# Patient Record
Sex: Female | Born: 1945
Health system: Southern US, Community
[De-identification: ages and names within clinical notes are randomized; demographics above are authoritative.]

---

## 2018-10-27 ENCOUNTER — Other Ambulatory Visit (HOSPITAL_COMMUNITY): Payer: Self-pay

## 2018-10-27 ENCOUNTER — Inpatient Hospital Stay
Admission: RE | Admit: 2018-10-27 | Discharge: 2018-12-06 | Disposition: E | Payer: Self-pay | Source: Other Acute Inpatient Hospital | Attending: Internal Medicine | Admitting: Internal Medicine

## 2018-10-27 DIAGNOSIS — J189 Pneumonia, unspecified organism: Secondary | ICD-10-CM

## 2018-10-27 DIAGNOSIS — J90 Pleural effusion, not elsewhere classified: Secondary | ICD-10-CM

## 2018-10-27 DIAGNOSIS — R0603 Acute respiratory distress: Secondary | ICD-10-CM

## 2018-10-27 DIAGNOSIS — Z0189 Encounter for other specified special examinations: Secondary | ICD-10-CM

## 2018-10-27 DIAGNOSIS — Z931 Gastrostomy status: Secondary | ICD-10-CM

## 2018-10-27 DIAGNOSIS — A419 Sepsis, unspecified organism: Secondary | ICD-10-CM

## 2018-10-27 DIAGNOSIS — T85598A Other mechanical complication of other gastrointestinal prosthetic devices, implants and grafts, initial encounter: Secondary | ICD-10-CM

## 2018-10-27 DIAGNOSIS — R0902 Hypoxemia: Secondary | ICD-10-CM

## 2018-10-27 LAB — COMPREHENSIVE METABOLIC PANEL
ALT: 9 U/L (ref 0–44)
AST: 32 U/L (ref 15–41)
Albumin: 1.8 g/dL — ABNORMAL LOW (ref 3.5–5.0)
Alkaline Phosphatase: 117 U/L (ref 38–126)
Anion gap: 8 (ref 5–15)
BUN: 17 mg/dL (ref 8–23)
CO2: 22 mmol/L (ref 22–32)
Calcium: 8.8 mg/dL — ABNORMAL LOW (ref 8.9–10.3)
Chloride: 106 mmol/L (ref 98–111)
Creatinine, Ser: 0.72 mg/dL (ref 0.44–1.00)
GFR calc Af Amer: >60 mL/min (ref >60)
GFR calc non Af Amer: >60 mL/min (ref >60)
Glucose, Bld: 99 mg/dL (ref 70–99)
Potassium: 4.9 mmol/L (ref 3.5–5.1)
Sodium: 136 mmol/L (ref 135–145)
Total Bilirubin: 1.2 mg/dL (ref 0.3–1.2)
Total Protein: 5.6 g/dL — ABNORMAL LOW (ref 6.5–8.1)

## 2018-10-28 ENCOUNTER — Other Ambulatory Visit (HOSPITAL_COMMUNITY): Payer: Self-pay

## 2018-10-28 LAB — CBC WITH DIFFERENTIAL/PLATELET
Abs Immature Granulocytes: 0.06 10*3/uL (ref 0.00–0.07)
Basophils Absolute: 0 10*3/uL (ref 0.0–0.1)
Basophils Relative: 0 %
Eosinophils Absolute: 0.4 10*3/uL (ref 0.0–0.5)
Eosinophils Relative: 6 %
HCT: 28.5 % — ABNORMAL LOW (ref 36.0–46.0)
Hemoglobin: 9.1 g/dL — ABNORMAL LOW (ref 12.0–15.0)
Immature Granulocytes: 1 %
Lymphocytes Relative: 14 %
Lymphs Abs: 1 10*3/uL (ref 0.7–4.0)
MCH: 30 pg (ref 26.0–34.0)
MCHC: 31.9 g/dL (ref 30.0–36.0)
MCV: 94.1 fL (ref 80.0–100.0)
Monocytes Absolute: 0.6 10*3/uL (ref 0.1–1.0)
Monocytes Relative: 8 %
Neutro Abs: 5.3 10*3/uL (ref 1.7–7.7)
Neutrophils Relative %: 71 %
Platelets: 580 10*3/uL — ABNORMAL HIGH (ref 150–400)
RBC: 3.03 MIL/uL — ABNORMAL LOW (ref 3.87–5.11)
RDW: 14.4 % (ref 11.5–15.5)
WBC: 7.4 10*3/uL (ref 4.0–10.5)
nRBC: 0 % (ref 0.0–0.2)

## 2018-10-28 LAB — MAGNESIUM: Magnesium: 1.7 mg/dL (ref 1.7–2.4)

## 2018-10-28 LAB — URINALYSIS, ROUTINE W REFLEX MICROSCOPIC
Bilirubin Urine: NEGATIVE
Glucose, UA: NEGATIVE mg/dL
Hgb urine dipstick: NEGATIVE
Ketones, ur: NEGATIVE mg/dL
Leukocytes,Ua: NEGATIVE
Nitrite: NEGATIVE
Protein, ur: 30 mg/dL — AB
Specific Gravity, Urine: 1.012 (ref 1.005–1.030)
pH: 6 (ref 5.0–8.0)

## 2018-10-28 LAB — PROTIME-INR
INR: 1.1 (ref 0.8–1.2)
Prothrombin Time: 14.3 seconds (ref 11.4–15.2)

## 2018-10-28 LAB — T4, FREE: Free T4: 1.02 ng/dL (ref 0.61–1.12)

## 2018-10-28 LAB — C DIFFICILE QUICK SCREEN W PCR REFLEX
C Diff antigen: NEGATIVE
C Diff interpretation: NOT DETECTED
C Diff toxin: NEGATIVE

## 2018-10-28 LAB — PHOSPHORUS: Phosphorus: 2 mg/dL — ABNORMAL LOW (ref 2.5–4.6)

## 2018-10-28 LAB — HEMOGLOBIN A1C
Hgb A1c MFr Bld: 5.4 % (ref 4.8–5.6)
Mean Plasma Glucose: 108.28 mg/dL

## 2018-10-28 LAB — TSH: TSH: 4.793 u[IU]/mL — ABNORMAL HIGH (ref 0.350–4.500)

## 2018-10-29 LAB — URINE CULTURE: Culture: NO GROWTH

## 2018-10-29 LAB — BASIC METABOLIC PANEL
Anion gap: 7 (ref 5–15)
BUN: 14 mg/dL (ref 8–23)
CO2: 23 mmol/L (ref 22–32)
Calcium: 8.5 mg/dL — ABNORMAL LOW (ref 8.9–10.3)
Chloride: 103 mmol/L (ref 98–111)
Creatinine, Ser: 0.82 mg/dL (ref 0.44–1.00)
GFR calc Af Amer: >60 mL/min (ref >60)
GFR calc non Af Amer: >60 mL/min (ref >60)
Glucose, Bld: 136 mg/dL — ABNORMAL HIGH (ref 70–99)
Potassium: 3.9 mmol/L (ref 3.5–5.1)
Sodium: 133 mmol/L — ABNORMAL LOW (ref 135–145)

## 2018-10-29 LAB — CBC
HCT: 25.4 % — ABNORMAL LOW (ref 36.0–46.0)
Hemoglobin: 8.3 g/dL — ABNORMAL LOW (ref 12.0–15.0)
MCH: 30.6 pg (ref 26.0–34.0)
MCHC: 32.7 g/dL (ref 30.0–36.0)
MCV: 93.7 fL (ref 80.0–100.0)
Platelets: 424 10*3/uL — ABNORMAL HIGH (ref 150–400)
RBC: 2.71 MIL/uL — ABNORMAL LOW (ref 3.87–5.11)
RDW: 14.4 % (ref 11.5–15.5)
WBC: 6.1 10*3/uL (ref 4.0–10.5)
nRBC: 0 % (ref 0.0–0.2)

## 2018-10-29 LAB — PHOSPHORUS: Phosphorus: 2.2 mg/dL — ABNORMAL LOW (ref 2.5–4.6)

## 2018-10-29 LAB — PROCALCITONIN: Procalcitonin: 0.17 ng/mL

## 2018-10-29 LAB — MAGNESIUM: Magnesium: 2.1 mg/dL (ref 1.7–2.4)

## 2018-10-30 ENCOUNTER — Other Ambulatory Visit (HOSPITAL_COMMUNITY): Payer: Self-pay

## 2018-10-30 LAB — RENAL FUNCTION PANEL
Albumin: 1.8 g/dL — ABNORMAL LOW (ref 3.5–5.0)
Anion gap: 8 (ref 5–15)
BUN: 18 mg/dL (ref 8–23)
CO2: 25 mmol/L (ref 22–32)
Calcium: 8.6 mg/dL — ABNORMAL LOW (ref 8.9–10.3)
Chloride: 103 mmol/L (ref 98–111)
Creatinine, Ser: 0.7 mg/dL (ref 0.44–1.00)
GFR calc Af Amer: >60 mL/min (ref >60)
GFR calc non Af Amer: >60 mL/min (ref >60)
Glucose, Bld: 133 mg/dL — ABNORMAL HIGH (ref 70–99)
Phosphorus: 3 mg/dL (ref 2.5–4.6)
Potassium: 3.5 mmol/L (ref 3.5–5.1)
Sodium: 136 mmol/L (ref 135–145)

## 2018-10-30 LAB — CBC
HCT: 27 % — ABNORMAL LOW (ref 36.0–46.0)
Hemoglobin: 8.7 g/dL — ABNORMAL LOW (ref 12.0–15.0)
MCH: 30.3 pg (ref 26.0–34.0)
MCHC: 32.2 g/dL (ref 30.0–36.0)
MCV: 94.1 fL (ref 80.0–100.0)
Platelets: 505 10*3/uL — ABNORMAL HIGH (ref 150–400)
RBC: 2.87 MIL/uL — ABNORMAL LOW (ref 3.87–5.11)
RDW: 14.5 % (ref 11.5–15.5)
WBC: 6.9 10*3/uL (ref 4.0–10.5)
nRBC: 0 % (ref 0.0–0.2)

## 2018-10-30 LAB — SARS CORONAVIRUS 2 BY RT PCR (HOSPITAL ORDER, PERFORMED IN ~~LOC~~ HOSPITAL LAB): SARS Coronavirus 2: NEGATIVE

## 2018-10-30 LAB — C-REACTIVE PROTEIN: CRP: 13.1 mg/dL — ABNORMAL HIGH (ref <1.0)

## 2018-10-30 LAB — PROCALCITONIN: Procalcitonin: <0.1 ng/mL

## 2018-10-30 LAB — SEDIMENTATION RATE: Sed Rate: 90 mm/hr — ABNORMAL HIGH (ref 0–22)

## 2018-10-30 LAB — MAGNESIUM: Magnesium: 1.9 mg/dL (ref 1.7–2.4)

## 2018-10-30 NOTE — Consult Note (Signed)
Chief Complaint: Patient was seen in consultation today for percutaneous gastric tube placement   Referring Physician(s): Dr Mariah Milling  Supervising Physician: Markus Daft  Patient Status: [facility]  History of Present Illness: Brittany Ross is a 73 y.o. female   Hx DM; Dementia; AMS Recent back surgery with post op infection Post OP PE /DVT DC anticoagulation secondary back hematoma and chest wall hematoma  Admitted to [facility] for long term care- Rehab Protein calorie malnutrition Dysphagia Deconditioning  Request for percutaneous gastric tube placement Imaging reviewed and approved with Dr Anselm Pancoast  No past medical history on file.  Allergies: Patient has no allergy information on record.  Medications: Prior to Admission medications   Not on File     No family history on file.  Social History   Socioeconomic History   Marital status: Not on file    Spouse name: Not on file   Number of children: Not on file   Years of education: Not on file   Highest education level: Not on file  Occupational History   Not on file  Social Needs   Financial resource strain: Not on file   Food insecurity    Worry: Not on file    Inability: Not on file   Transportation needs    Medical: Not on file    Non-medical: Not on file  Tobacco Use   Smoking status: Not on file  Substance and Sexual Activity   Alcohol use: Not on file   Drug use: Not on file   Sexual activity: Not on file  Lifestyle   Physical activity    Days per week: Not on file    Minutes per session: Not on file   Stress: Not on file  Relationships   Social connections    Talks on phone: Not on file    Gets together: Not on file    Attends religious service: Not on file    Active member of club or organization: Not on file    Attends meetings of clubs or organizations: Not on file    Relationship status: Not on file  Other Topics Concern   Not on file  Social History Narrative     Not on file    Review of Systems: A 12 point ROS discussed and pertinent positives are indicated in the HPI above.  All other systems are negative.   Vital Signs: There were no vitals taken for this visit.  Physical Exam Vitals signs reviewed.  Cardiovascular:     Rate and Rhythm: Normal rate and regular rhythm.     Heart sounds: Normal heart sounds.  Pulmonary:     Breath sounds: Normal breath sounds.  Abdominal:     Palpations: Abdomen is soft.  Musculoskeletal: Normal range of motion.  Skin:    General: Skin is warm and dry.  Neurological:     Mental Status: She is alert. She is disoriented.  Psychiatric:     Comments: Confused  Consented with Daughter Brittany Ross via phone     Imaging: Ct Abdomen Wo Contrast  Result Date: 10/30/2018 CLINICAL DATA:  Assessment of anatomy for potential percutaneous gastrostomy tube placement. EXAM: CT ABDOMEN WITHOUT CONTRAST TECHNIQUE: Multidetector CT imaging of the abdomen was performed following the standard protocol without IV contrast. COMPARISON:  Abdominal x-ray on 10/30/2018 FINDINGS: Lower chest: Small bilateral pleural effusions with bibasilar atelectasis. Asymmetric density within the inferior right breast tissues. This could represent asymmetric visualization of the breast tissues. Significant density  present is suggestive of potentially some type of underlying pathologic process such as hemorrhage, infection or neoplasm. Correlation is suggested with physical examination. Hepatobiliary: No focal liver abnormality is seen. Status post cholecystectomy. No biliary dilatation. Pancreas: Unremarkable. No pancreatic ductal dilatation or surrounding inflammatory changes. Spleen: Normal in size without focal abnormality. Adrenals/Urinary Tract: Adrenal glands are unremarkable. Kidneys are normal, without renal calculi, focal lesion, or hydronephrosis. Stomach/Bowel: No hiatal hernia visualized. A feeding tube enters the stomach and terminates  in the proximal duodenum. The stomach is normally positioned. There is no colonic interposition between the stomach and the abdominal wall. No evidence of bowel obstruction, ileus or free air. Vascular/Lymphatic: IVC filter present in the infrarenal IVC. No enlarged lymph nodes identified in the abdomen. Other: No abnormal fluid collections or ascites. There is some body wall edema in the lateral body wall soft tissues, right greater than left. Musculoskeletal: Evidence of lumbar spinal fusion. IMPRESSION: 1. Feeding tube extends into the proximal duodenum. No evidence abnormal gastric anatomy or other findings that would increase risk of attempted percutaneous gastrostomy tube placement. 2. Small bilateral pleural effusions with bibasilar atelectasis. 3. Asymmetric density within the inferior right breast tissues. This is suggestive of potentially underlying process such as hemorrhage, infection or neoplasm. Correlation is suggested with physical examination. 4. Body wall edema or hemorrhage, right greater than left, in the lateral abdominal wall soft tissues. Electronically Signed   By: Irish LackGlenn  Yamagata M.D.   On: 10/30/2018 08:00   Dg Chest Port 1 View  Result Date: 10-May-2018 CLINICAL DATA:  Sepsis. EXAM: PORTABLE CHEST 1 VIEW COMPARISON:  None. FINDINGS: Left upper extremity PICC tip in the mid SVC. Elevated right hemidiaphragm. Streaky bibasilar opacities favoring atelectasis. Normal heart size for technique. No pulmonary edema. No large pleural effusion or pneumothorax. Coils project over the right axilla. IMPRESSION: 1. Streaky bibasilar opacities favoring atelectasis. 2. Left upper extremity PICC tip in the mid SVC. Electronically Signed   By: Narda RutherfordMelanie  Sanford M.D.   On: 004-Jan-2020 19:32   Dg Abd Portable 1v  Result Date: 10/30/2018 CLINICAL DATA:  NG tube placement EXAM: PORTABLE ABDOMEN - 1 VIEW COMPARISON:  X-ray dated October 28, 2018 FINDINGS: The enteric tube projects over the expected region of  the gastric antrum/pylorus. The tip is pointed distally. A retrievable IVC filter is noted. Extensive lumbar fusion hardware is seen. IMPRESSION: Enteric tube tip projects over the gastric antrum/pylorus. Electronically Signed   By: Katherine Mantlehristopher  Green M.D.   On: 10/30/2018 03:29   Dg Abd Portable 1v  Result Date: 10/28/2018 CLINICAL DATA:  NG tube placement. EXAM: PORTABLE ABDOMEN - 1 VIEW COMPARISON:  004-Jan-2020. FINDINGS: NG tube noted with tip over the stomach. Drainage catheter again noted over the left abdomen in unchanged position. Surgical clips right upper quadrant. IVC filter. Prior lumbosacral spine fusion. Hardware appears to be intact. No bowel distention or free air. Elevation right hemidiaphragm with right base atelectasis again noted. IMPRESSION: NG tube noted with tip over the stomach. No bowel distention. Drainage catheter again noted over the left abdomen in unchanged position. Electronically Signed   By: Maisie Fushomas  Register   On: 10/28/2018 06:56   Dg Abd Portable 1v  Result Date: 10-May-2018 CLINICAL DATA:  Enteric tube placement. EXAM: PORTABLE ABDOMEN - 1 VIEW COMPARISON:  None. FINDINGS: An enteric tube is not identified. There is a tube overlying the left abdomen and lumbar spine. The bowel gas pattern is normal. No radio-opaque calculi or other significant radiographic abnormality are seen. IVC  filter noted. No acute osseous abnormality. Prior lumbosacral fusion. IMPRESSION: 1. No enteric tube identified. 2. Tube overlying the left abdomen and lumbar spine could reflect a gastrostomy or jejunostomy tube, or possibly a wound VAC if lumbar surgery was recent. Correlate clinically. Electronically Signed   By: Obie DredgeWilliam T Derry M.D.   On: 08/11/2018 19:32    Labs:  CBC: Recent Labs    10/28/18 0657 10/29/18 0705 10/30/18 0953  WBC 7.4 6.1 6.9  HGB 9.1* 8.3* 8.7*  HCT 28.5* 25.4* 27.0*  PLT 580* 424* 505*    COAGS: Recent Labs    10/28/18 0657  INR 1.1    BMP: Recent  Labs    2018/05/14 1914 10/29/18 0705 10/30/18 0953  NA 136 133* 136  K 4.9 3.9 3.5  CL 106 103 103  CO2 22 23 25   GLUCOSE 99 136* 133*  BUN 17 14 18   CALCIUM 8.8* 8.5* 8.6*  CREATININE 0.72 0.82 0.70  GFRNONAA >60 >60 >60  GFRAA >60 >60 >60    LIVER FUNCTION TESTS: Recent Labs    2018/05/14 1914 10/30/18 0953  BILITOT 1.2  --   AST 32  --   ALT 9  --   ALKPHOS 117  --   PROT 5.6*  --   ALBUMIN 1.8* 1.8*    TUMOR MARKERS: No results for input(s): AFPTM, CEA, CA199, CHROMGRNA in the last 8760 hours.  Assessment and Plan:  Dementia AMS Dysphagia Deconditioning PCM Long term care Scheduled for percutaneous gastric tube placement Risks and benefits discussed with the patient's daughter Brittany Ross via phone including, but not limited to the need for a barium enema during the procedure, bleeding, infection, peritonitis, or damage to adjacent structures.  All of her questions were answered, is agreeable to proceed. Consent signed and in chart.   Thank you for this interesting consult.  I greatly enjoyed meeting Brittany Ross and look forward to participating in their care.  A copy of this report was sent to the requesting provider on this date.  Electronically Signed: Robet LeuPamela A Arcadia Gorgas, PA-C 10/30/2018, 11:42 AM   I spent a total of 40 Minutes    in face to face in clinical consultation, greater than 50% of which was counseling/coordinating care for percutaneous gastric tube placement

## 2018-10-31 ENCOUNTER — Other Ambulatory Visit (HOSPITAL_COMMUNITY): Payer: Self-pay

## 2018-10-31 ENCOUNTER — Encounter (HOSPITAL_COMMUNITY): Payer: Self-pay | Admitting: Interventional Radiology

## 2018-10-31 HISTORY — PX: IR GASTROSTOMY TUBE MOD SED: IMG625

## 2018-10-31 LAB — PROCALCITONIN: Procalcitonin: 0.11 ng/mL

## 2018-10-31 LAB — CULTURE, RESPIRATORY W GRAM STAIN

## 2018-10-31 LAB — VANCOMYCIN, TROUGH: Vancomycin Tr: 18 ug/mL (ref 15–20)

## 2018-10-31 MED ORDER — FENTANYL CITRATE (PF) 100 MCG/2ML IJ SOLN
INTRAMUSCULAR | Status: AC | PRN
  Administered 2018-10-31: 50 ug via INTRAVENOUS

## 2018-10-31 MED ORDER — FENTANYL CITRATE (PF) 100 MCG/2ML IJ SOLN
INTRAMUSCULAR | Status: AC
  Filled 2018-10-31: qty 2

## 2018-10-31 MED ORDER — CEFAZOLIN SODIUM-DEXTROSE 1-4 GM/50ML-% IV SOLN
INTRAVENOUS | Status: AC | PRN
  Administered 2018-10-31: 2 g via INTRAVENOUS

## 2018-10-31 MED ORDER — MIDAZOLAM HCL 2 MG/2ML IJ SOLN
INTRAMUSCULAR | Status: AC | PRN
  Administered 2018-10-31: 1 mg via INTRAVENOUS

## 2018-10-31 MED ORDER — IOHEXOL 300 MG/ML  SOLN
50.0000 mL | Freq: Once | INTRAMUSCULAR | Status: AC | PRN
  Administered 2018-10-31: 15 mL via INTRAVENOUS

## 2018-10-31 MED ORDER — GLUCAGON HCL RDNA (DIAGNOSTIC) 1 MG IJ SOLR
INTRAMUSCULAR | Status: AC
  Filled 2018-10-31: qty 1

## 2018-10-31 MED ORDER — LIDOCAINE HCL (PF) 1 % IJ SOLN
INTRAMUSCULAR | Status: AC | PRN
  Administered 2018-10-31: 10 mL

## 2018-10-31 MED ORDER — MIDAZOLAM HCL 2 MG/2ML IJ SOLN
INTRAMUSCULAR | Status: AC
  Filled 2018-10-31: qty 2

## 2018-10-31 MED ORDER — LIDOCAINE HCL 1 % IJ SOLN
INTRAMUSCULAR | Status: AC
  Filled 2018-10-31: qty 20

## 2018-10-31 NOTE — Procedures (Addendum)
Interventional Radiology Procedure Note  Procedure: Percutaneous gastrostomy tube placement  Complications: None  Estimated Blood Loss: < 10 mL  Findings: 24 Fr bumper retention gastrostomy tube placed with tip in body of stomach. OK to  use in 24 hours.  Kerah Hardebeck T. Faith Branan, M.D Pager:  [phone]    

## 2018-11-01 LAB — CBC
HCT: 27.5 % — ABNORMAL LOW (ref 36.0–46.0)
Hemoglobin: 8.8 g/dL — ABNORMAL LOW (ref 12.0–15.0)
MCH: 30.4 pg (ref 26.0–34.0)
MCHC: 32 g/dL (ref 30.0–36.0)
MCV: 95.2 fL (ref 80.0–100.0)
Platelets: 448 10*3/uL — ABNORMAL HIGH (ref 150–400)
RBC: 2.89 MIL/uL — ABNORMAL LOW (ref 3.87–5.11)
RDW: 14.8 % (ref 11.5–15.5)
WBC: 8.2 10*3/uL (ref 4.0–10.5)
nRBC: 0 % (ref 0.0–0.2)

## 2018-11-01 LAB — BASIC METABOLIC PANEL
Anion gap: 9 (ref 5–15)
BUN: 22 mg/dL (ref 8–23)
CO2: 23 mmol/L (ref 22–32)
Calcium: 8.6 mg/dL — ABNORMAL LOW (ref 8.9–10.3)
Chloride: 105 mmol/L (ref 98–111)
Creatinine, Ser: 0.71 mg/dL (ref 0.44–1.00)
GFR calc Af Amer: >60 mL/min (ref >60)
GFR calc non Af Amer: >60 mL/min (ref >60)
Glucose, Bld: 97 mg/dL (ref 70–99)
Potassium: 3.7 mmol/L (ref 3.5–5.1)
Sodium: 137 mmol/L (ref 135–145)

## 2018-11-01 LAB — CULTURE, BLOOD (ROUTINE X 2)
Culture: NO GROWTH
Culture: NO GROWTH
Special Requests: ADEQUATE
Special Requests: ADEQUATE

## 2018-11-01 LAB — MAGNESIUM: Magnesium: 1.8 mg/dL (ref 1.7–2.4)

## 2018-11-01 LAB — PHOSPHORUS: Phosphorus: 2.8 mg/dL (ref 2.5–4.6)

## 2018-11-01 NOTE — Progress Notes (Signed)
Gastrostomy insertion site check - originally placed yesterday in IR by Dr. Yamagata.  Insertion site clean, dry, intact, dressed appropriately. Small amount of fresh blood on gauze, no obvious active bleeding or discharge.  May use for tube feeds/medications/free water beginning at 1400 today. Orders in chart, RN aware.  Please call IR with questions or concerns.  Shannon Watterson, PA-C 

## 2018-11-02 ENCOUNTER — Other Ambulatory Visit (HOSPITAL_COMMUNITY): Payer: Self-pay

## 2018-11-03 LAB — CBC
HCT: 25.4 % — ABNORMAL LOW (ref 36.0–46.0)
Hemoglobin: 8.1 g/dL — ABNORMAL LOW (ref 12.0–15.0)
MCH: 30.8 pg (ref 26.0–34.0)
MCHC: 31.9 g/dL (ref 30.0–36.0)
MCV: 96.6 fL (ref 80.0–100.0)
Platelets: 472 10*3/uL — ABNORMAL HIGH (ref 150–400)
RBC: 2.63 MIL/uL — ABNORMAL LOW (ref 3.87–5.11)
RDW: 15.3 % (ref 11.5–15.5)
WBC: 8.6 10*3/uL (ref 4.0–10.5)
nRBC: 0 % (ref 0.0–0.2)

## 2018-11-03 LAB — BASIC METABOLIC PANEL
Anion gap: 9 (ref 5–15)
BUN: 22 mg/dL (ref 8–23)
CO2: 26 mmol/L (ref 22–32)
Calcium: 8.8 mg/dL — ABNORMAL LOW (ref 8.9–10.3)
Chloride: 106 mmol/L (ref 98–111)
Creatinine, Ser: 0.74 mg/dL (ref 0.44–1.00)
GFR calc Af Amer: >60 mL/min (ref >60)
GFR calc non Af Amer: >60 mL/min (ref >60)
Glucose, Bld: 137 mg/dL — ABNORMAL HIGH (ref 70–99)
Potassium: 3.1 mmol/L — ABNORMAL LOW (ref 3.5–5.1)
Sodium: 141 mmol/L (ref 135–145)

## 2018-11-04 LAB — BASIC METABOLIC PANEL
Anion gap: 8 (ref 5–15)
BUN: 23 mg/dL (ref 8–23)
CO2: 27 mmol/L (ref 22–32)
Calcium: 8.7 mg/dL — ABNORMAL LOW (ref 8.9–10.3)
Chloride: 105 mmol/L (ref 98–111)
Creatinine, Ser: 0.68 mg/dL (ref 0.44–1.00)
GFR calc Af Amer: >60 mL/min (ref >60)
GFR calc non Af Amer: >60 mL/min (ref >60)
Glucose, Bld: 110 mg/dL — ABNORMAL HIGH (ref 70–99)
Potassium: 3.5 mmol/L (ref 3.5–5.1)
Sodium: 140 mmol/L (ref 135–145)

## 2018-11-05 LAB — VANCOMYCIN, TROUGH: Vancomycin Tr: 20 ug/mL (ref 15–20)

## 2018-11-05 DEATH — deceased

## 2018-11-06 ENCOUNTER — Other Ambulatory Visit (HOSPITAL_BASED_OUTPATIENT_CLINIC_OR_DEPARTMENT_OTHER): Payer: Medicare Other

## 2018-11-06 DIAGNOSIS — I361 Nonrheumatic tricuspid (valve) insufficiency: Secondary | ICD-10-CM

## 2018-11-06 DIAGNOSIS — I340 Nonrheumatic mitral (valve) insufficiency: Secondary | ICD-10-CM | POA: Diagnosis not present

## 2018-11-06 LAB — URINALYSIS, ROUTINE W REFLEX MICROSCOPIC
Bilirubin Urine: NEGATIVE
Glucose, UA: NEGATIVE mg/dL
Ketones, ur: NEGATIVE mg/dL
Nitrite: NEGATIVE
Protein, ur: 30 mg/dL — AB
Specific Gravity, Urine: 1.009 (ref 1.005–1.030)
pH: 7 (ref 5.0–8.0)

## 2018-11-06 LAB — CBC
HCT: 27.9 % — ABNORMAL LOW (ref 36.0–46.0)
Hemoglobin: 8.7 g/dL — ABNORMAL LOW (ref 12.0–15.0)
MCH: 30.1 pg (ref 26.0–34.0)
MCHC: 31.2 g/dL (ref 30.0–36.0)
MCV: 96.5 fL (ref 80.0–100.0)
Platelets: 423 K/uL — ABNORMAL HIGH (ref 150–400)
RBC: 2.89 MIL/uL — ABNORMAL LOW (ref 3.87–5.11)
RDW: 15.4 % (ref 11.5–15.5)
WBC: 7.3 K/uL (ref 4.0–10.5)
nRBC: 0 % (ref 0.0–0.2)

## 2018-11-06 LAB — BASIC METABOLIC PANEL
Anion gap: 7 (ref 5–15)
BUN: 24 mg/dL — ABNORMAL HIGH (ref 8–23)
CO2: 34 mmol/L — ABNORMAL HIGH (ref 22–32)
Calcium: 8.6 mg/dL — ABNORMAL LOW (ref 8.9–10.3)
Chloride: 100 mmol/L (ref 98–111)
Creatinine, Ser: 0.74 mg/dL (ref 0.44–1.00)
GFR calc Af Amer: >60 mL/min (ref >60)
GFR calc non Af Amer: >60 mL/min (ref >60)
Glucose, Bld: 142 mg/dL — ABNORMAL HIGH (ref 70–99)
Potassium: 2.9 mmol/L — ABNORMAL LOW (ref 3.5–5.1)
Sodium: 141 mmol/L (ref 135–145)

## 2018-11-06 LAB — AMMONIA: Ammonia: 14 umol/L (ref 9–35)

## 2018-11-06 LAB — ECHOCARDIOGRAM COMPLETE

## 2018-11-07 LAB — URINE CULTURE

## 2018-11-07 LAB — POTASSIUM
Potassium: 5.8 mmol/L — ABNORMAL HIGH (ref 3.5–5.1)
Potassium: 3.7 mmol/L (ref 3.5–5.1)

## 2018-11-08 ENCOUNTER — Other Ambulatory Visit (HOSPITAL_COMMUNITY): Payer: Self-pay

## 2018-11-08 ENCOUNTER — Encounter: Payer: Self-pay | Admitting: Radiology

## 2018-11-08 LAB — CULTURE, RESPIRATORY W GRAM STAIN

## 2018-11-08 LAB — POTASSIUM: Potassium: 3.8 mmol/L (ref 3.5–5.1)

## 2018-11-08 MED ORDER — IOHEXOL 300 MG/ML  SOLN
100.0000 mL | Freq: Once | INTRAMUSCULAR | Status: AC | PRN
  Administered 2018-11-08: 17:00:00 75 mL via INTRAVENOUS

## 2018-11-09 ENCOUNTER — Other Ambulatory Visit (HOSPITAL_COMMUNITY): Payer: Self-pay

## 2018-11-09 LAB — BASIC METABOLIC PANEL
Anion gap: 10 (ref 5–15)
BUN: 31 mg/dL — ABNORMAL HIGH (ref 8–23)
CO2: 35 mmol/L — ABNORMAL HIGH (ref 22–32)
Calcium: 9.2 mg/dL (ref 8.9–10.3)
Chloride: 97 mmol/L — ABNORMAL LOW (ref 98–111)
Creatinine, Ser: 0.89 mg/dL (ref 0.44–1.00)
GFR calc Af Amer: >60 mL/min (ref >60)
GFR calc non Af Amer: >60 mL/min (ref >60)
Glucose, Bld: 154 mg/dL — ABNORMAL HIGH (ref 70–99)
Potassium: 3.5 mmol/L (ref 3.5–5.1)
Sodium: 142 mmol/L (ref 135–145)

## 2018-11-09 LAB — CBC
HCT: 30.9 % — ABNORMAL LOW (ref 36.0–46.0)
Hemoglobin: 9.7 g/dL — ABNORMAL LOW (ref 12.0–15.0)
MCH: 30.9 pg (ref 26.0–34.0)
MCHC: 31.4 g/dL (ref 30.0–36.0)
MCV: 98.4 fL (ref 80.0–100.0)
Platelets: 409 10*3/uL — ABNORMAL HIGH (ref 150–400)
RBC: 3.14 MIL/uL — ABNORMAL LOW (ref 3.87–5.11)
RDW: 15.9 % — ABNORMAL HIGH (ref 11.5–15.5)
WBC: 10 10*3/uL (ref 4.0–10.5)
nRBC: 0 % (ref 0.0–0.2)

## 2018-11-09 LAB — MAGNESIUM: Magnesium: 2.1 mg/dL (ref 1.7–2.4)

## 2018-11-10 LAB — AMMONIA: Ammonia: 18 umol/L (ref 9–35)

## 2018-11-10 LAB — PROCALCITONIN: Procalcitonin: 0.21 ng/mL

## 2018-11-10 LAB — C-REACTIVE PROTEIN: CRP: 20.8 mg/dL — ABNORMAL HIGH (ref <1.0)

## 2018-11-10 LAB — VANCOMYCIN, TROUGH: Vancomycin Tr: 20 ug/mL (ref 15–20)

## 2018-11-10 LAB — SEDIMENTATION RATE: Sed Rate: 92 mm/hr — ABNORMAL HIGH (ref 0–22)

## 2018-11-10 LAB — URINE CULTURE: Culture: <10000 — AB

## 2018-11-11 LAB — CULTURE, BLOOD (ROUTINE X 2)
Culture: NO GROWTH
Culture: NO GROWTH

## 2018-11-12 LAB — CBC
HCT: 29.9 % — ABNORMAL LOW (ref 36.0–46.0)
Hemoglobin: 9.2 g/dL — ABNORMAL LOW (ref 12.0–15.0)
MCH: 30.6 pg (ref 26.0–34.0)
MCHC: 30.8 g/dL (ref 30.0–36.0)
MCV: 99.3 fL (ref 80.0–100.0)
Platelets: 427 10*3/uL — ABNORMAL HIGH (ref 150–400)
RBC: 3.01 MIL/uL — ABNORMAL LOW (ref 3.87–5.11)
RDW: 16.2 % — ABNORMAL HIGH (ref 11.5–15.5)
WBC: 12.8 10*3/uL — ABNORMAL HIGH (ref 4.0–10.5)
nRBC: 0 % (ref 0.0–0.2)

## 2018-11-12 LAB — BASIC METABOLIC PANEL
Anion gap: 10 (ref 5–15)
BUN: 44 mg/dL — ABNORMAL HIGH (ref 8–23)
CO2: 33 mmol/L — ABNORMAL HIGH (ref 22–32)
Calcium: 8.8 mg/dL — ABNORMAL LOW (ref 8.9–10.3)
Chloride: 99 mmol/L (ref 98–111)
Creatinine, Ser: 1.13 mg/dL — ABNORMAL HIGH (ref 0.44–1.00)
GFR calc Af Amer: 55 mL/min — ABNORMAL LOW (ref >60)
GFR calc non Af Amer: 48 mL/min — ABNORMAL LOW (ref >60)
Glucose, Bld: 186 mg/dL — ABNORMAL HIGH (ref 70–99)
Potassium: 4.3 mmol/L (ref 3.5–5.1)
Sodium: 142 mmol/L (ref 135–145)

## 2018-11-12 LAB — PHOSPHORUS: Phosphorus: 3.1 mg/dL (ref 2.5–4.6)

## 2018-11-12 LAB — VANCOMYCIN, TROUGH: Vancomycin Tr: 13 ug/mL — ABNORMAL LOW (ref 15–20)

## 2018-11-12 LAB — MAGNESIUM: Magnesium: 2.3 mg/dL (ref 1.7–2.4)

## 2018-11-14 LAB — CBC
HCT: 29.8 % — ABNORMAL LOW (ref 36.0–46.0)
Hemoglobin: 9.1 g/dL — ABNORMAL LOW (ref 12.0–15.0)
MCH: 30.4 pg (ref 26.0–34.0)
MCHC: 30.5 g/dL (ref 30.0–36.0)
MCV: 99.7 fL (ref 80.0–100.0)
Platelets: 398 10*3/uL (ref 150–400)
RBC: 2.99 MIL/uL — ABNORMAL LOW (ref 3.87–5.11)
RDW: 15.9 % — ABNORMAL HIGH (ref 11.5–15.5)
WBC: 13.1 10*3/uL — ABNORMAL HIGH (ref 4.0–10.5)
nRBC: 0.2 % (ref 0.0–0.2)

## 2018-11-14 LAB — RENAL FUNCTION PANEL
Albumin: 1.5 g/dL — ABNORMAL LOW (ref 3.5–5.0)
Anion gap: 9 (ref 5–15)
BUN: 44 mg/dL — ABNORMAL HIGH (ref 8–23)
CO2: 33 mmol/L — ABNORMAL HIGH (ref 22–32)
Calcium: 8.7 mg/dL — ABNORMAL LOW (ref 8.9–10.3)
Chloride: 103 mmol/L (ref 98–111)
Creatinine, Ser: 0.95 mg/dL (ref 0.44–1.00)
GFR calc Af Amer: >60 mL/min (ref >60)
GFR calc non Af Amer: 59 mL/min — ABNORMAL LOW (ref >60)
Glucose, Bld: 184 mg/dL — ABNORMAL HIGH (ref 70–99)
Phosphorus: 3.1 mg/dL (ref 2.5–4.6)
Potassium: 3.3 mmol/L — ABNORMAL LOW (ref 3.5–5.1)
Sodium: 145 mmol/L (ref 135–145)

## 2018-11-14 LAB — MAGNESIUM: Magnesium: 2.1 mg/dL (ref 1.7–2.4)

## 2018-11-15 LAB — POTASSIUM: Potassium: 3.6 mmol/L (ref 3.5–5.1)

## 2018-11-16 ENCOUNTER — Other Ambulatory Visit (HOSPITAL_COMMUNITY): Payer: Self-pay

## 2018-11-16 LAB — RENAL FUNCTION PANEL
Albumin: 1.3 g/dL — ABNORMAL LOW (ref 3.5–5.0)
Anion gap: 9 (ref 5–15)
BUN: 51 mg/dL — ABNORMAL HIGH (ref 8–23)
CO2: 30 mmol/L (ref 22–32)
Calcium: 8.9 mg/dL (ref 8.9–10.3)
Chloride: 109 mmol/L (ref 98–111)
Creatinine, Ser: 1.34 mg/dL — ABNORMAL HIGH (ref 0.44–1.00)
GFR calc Af Amer: 45 mL/min — ABNORMAL LOW
GFR calc non Af Amer: 39 mL/min — ABNORMAL LOW
Glucose, Bld: 131 mg/dL — ABNORMAL HIGH (ref 70–99)
Phosphorus: 3.1 mg/dL (ref 2.5–4.6)
Potassium: 3.8 mmol/L (ref 3.5–5.1)
Sodium: 148 mmol/L — ABNORMAL HIGH (ref 135–145)

## 2018-11-16 LAB — CBC
HCT: 34.4 % — ABNORMAL LOW (ref 36.0–46.0)
Hemoglobin: 10.4 g/dL — ABNORMAL LOW (ref 12.0–15.0)
MCH: 30.5 pg (ref 26.0–34.0)
MCHC: 30.2 g/dL (ref 30.0–36.0)
MCV: 100.9 fL — ABNORMAL HIGH (ref 80.0–100.0)
Platelets: 470 K/uL — ABNORMAL HIGH (ref 150–400)
RBC: 3.41 MIL/uL — ABNORMAL LOW (ref 3.87–5.11)
RDW: 16.7 % — ABNORMAL HIGH (ref 11.5–15.5)
WBC: 16.7 K/uL — ABNORMAL HIGH (ref 4.0–10.5)
nRBC: 0.5 % — ABNORMAL HIGH (ref 0.0–0.2)

## 2018-11-16 LAB — LACTIC ACID, PLASMA: Lactic Acid, Venous: 2.9 mmol/L (ref 0.5–1.9)

## 2018-11-16 LAB — MAGNESIUM: Magnesium: 2.2 mg/dL (ref 1.7–2.4)

## 2018-12-06 DEATH — deceased

## 2020-09-04 IMAGING — DX PORTABLE CHEST - 1 VIEW
1 series · 1 of 1 positions shown · non-contrast
Comparison: None.

CLINICAL DATA: Sepsis.

EXAM:
PORTABLE CHEST 1 VIEW

[chest]
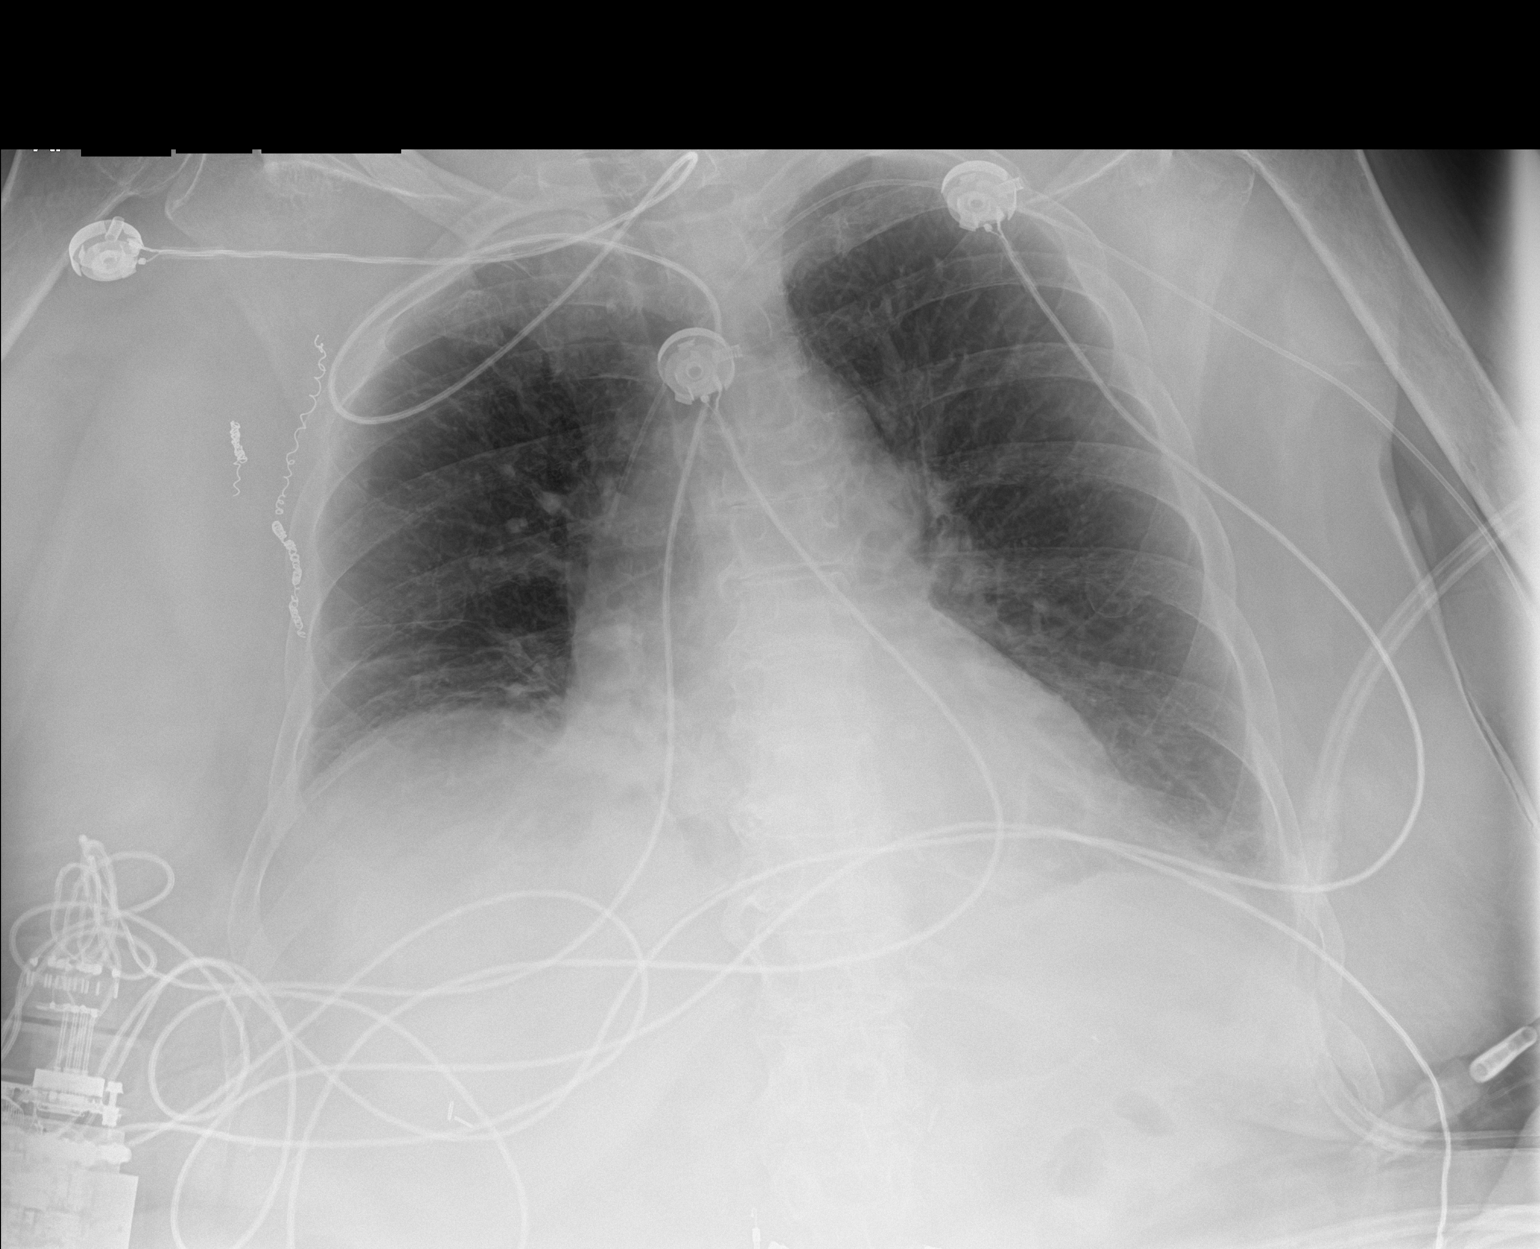

[1 of 1 positions shown; findings below may reference images not displayed]

FINDINGS: Left upper extremity PICC tip in the mid SVC. Elevated right
hemidiaphragm. Streaky bibasilar opacities favoring atelectasis.
Normal heart size for technique. No pulmonary edema. No large
pleural effusion or pneumothorax. Coils project over the right
axilla.
IMPRESSION: 1. Streaky bibasilar opacities favoring atelectasis.
2. Left upper extremity PICC tip in the mid SVC.

## 2020-09-04 IMAGING — DX PORTABLE ABDOMEN - 1 VIEW
1 series · 1 of 1 positions shown · non-contrast
Comparison: None.

CLINICAL DATA: Enteric tube placement.

EXAM:
PORTABLE ABDOMEN - 1 VIEW

[abdomen]
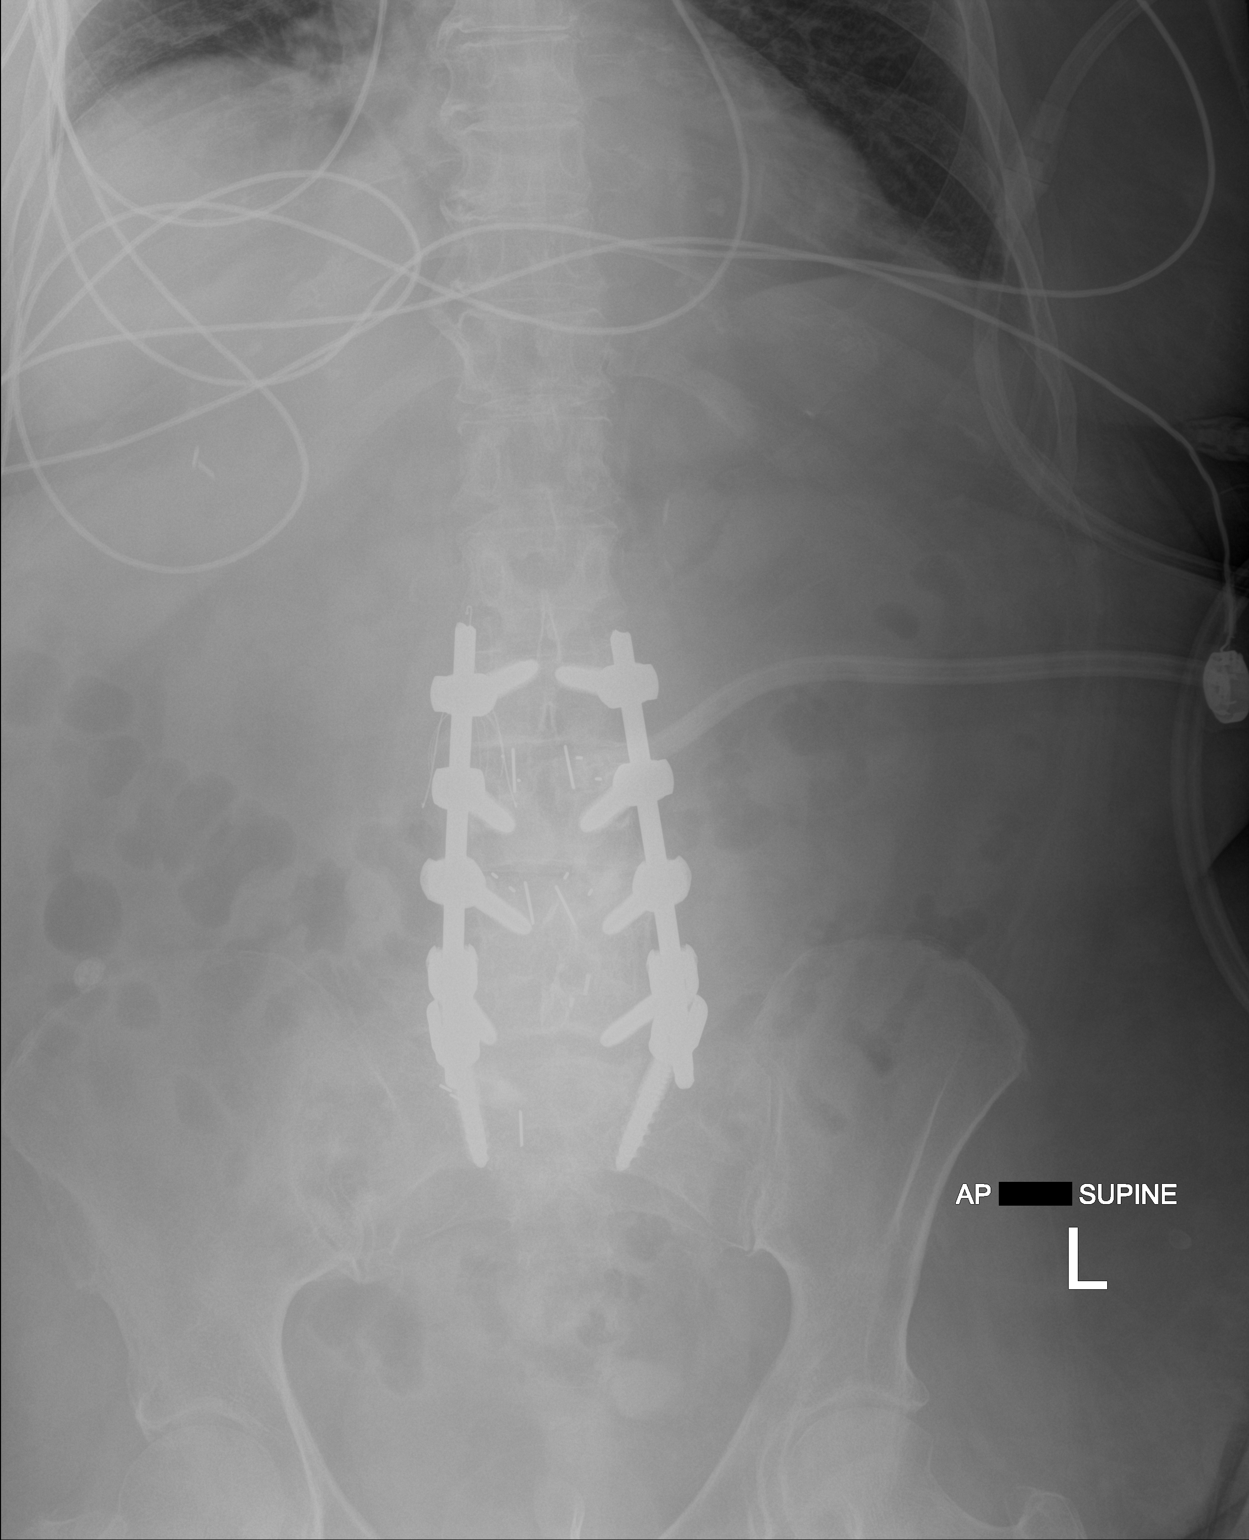

[1 of 1 positions shown; findings below may reference images not displayed]

FINDINGS: An enteric tube is not identified. There is a tube overlying the
left abdomen and lumbar spine. The bowel gas pattern is normal. No
radio-opaque calculi or other significant radiographic abnormality
are seen. IVC filter noted. No acute osseous abnormality. Prior
lumbosacral fusion.
IMPRESSION: 1. No enteric tube identified.
2. Tube overlying the left abdomen and lumbar spine could reflect a
gastrostomy or jejunostomy tube, or possibly a wound VAC if lumbar
surgery was recent. Correlate clinically.

## 2020-09-05 IMAGING — DX PORTABLE ABDOMEN - 1 VIEW
1 series · 1 of 1 positions shown · non-contrast
Comparison: 10/27/2018.

CLINICAL DATA: NG tube placement.

EXAM:
PORTABLE ABDOMEN - 1 VIEW

[abdomen kub]
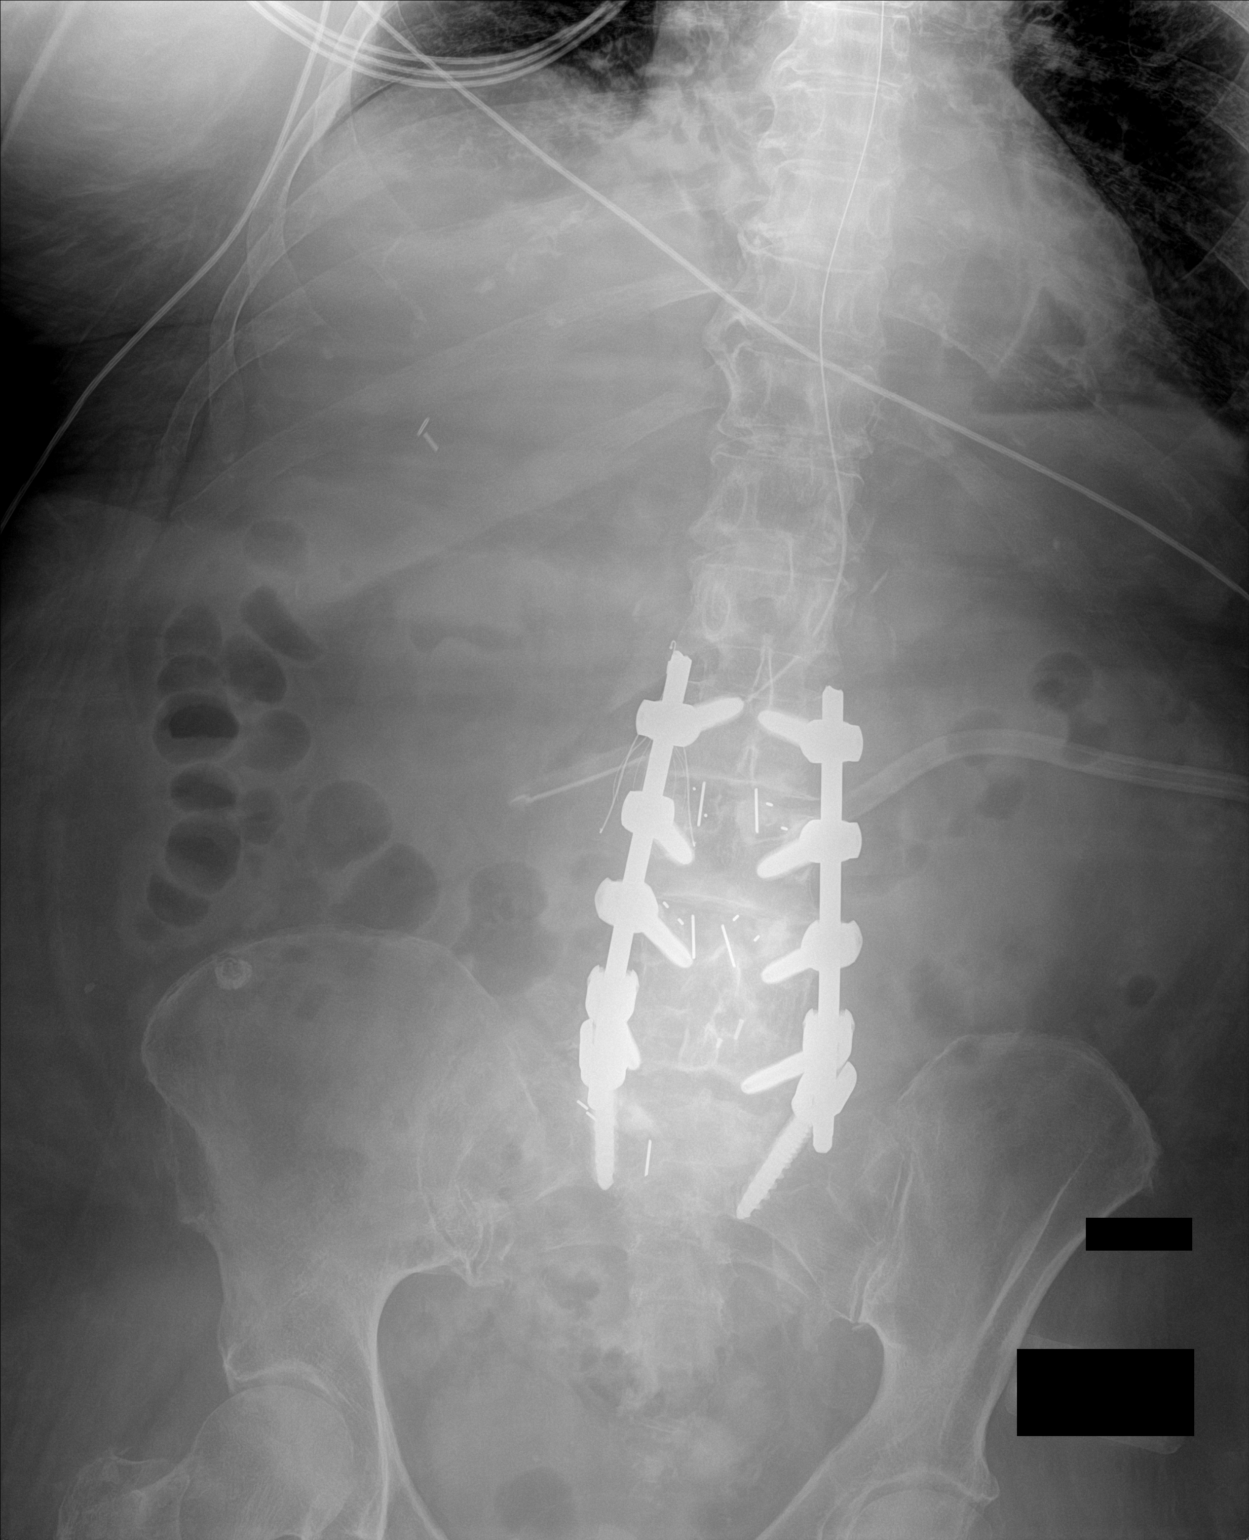

[1 of 1 positions shown; findings below may reference images not displayed]

FINDINGS: NG tube noted with tip over the stomach. Drainage catheter again
noted over the left abdomen in unchanged position. Surgical clips
right upper quadrant. IVC filter. Prior lumbosacral spine fusion.
Hardware appears to be intact. No bowel distention or free air.
Elevation right hemidiaphragm with right base atelectasis again
noted.
IMPRESSION: NG tube noted with tip over the stomach. No bowel distention.
Drainage catheter again noted over the left abdomen in unchanged
position.

## 2020-09-07 IMAGING — CT CT ABDOMEN WITHOUT CONTRAST
2 of 4 series · 15 of 46 positions shown, 17 images · non-contrast
Comparison: Abdominal x-ray on 10/30/2018

CLINICAL DATA: Assessment of anatomy for potential percutaneous
gastrostomy tube placement.

EXAM:
CT ABDOMEN WITHOUT CONTRAST
TECHNIQUE: Multidetector CT imaging of the abdomen was performed following the
standard protocol without IV contrast.

[Series 3: a/p w/o 5mm · axial · non-contrast · 0.91mm/px · z∈[+987,+1207]mm · 12 of 50 slices shown, 14 images]
[im 3/50  soft-tissue]
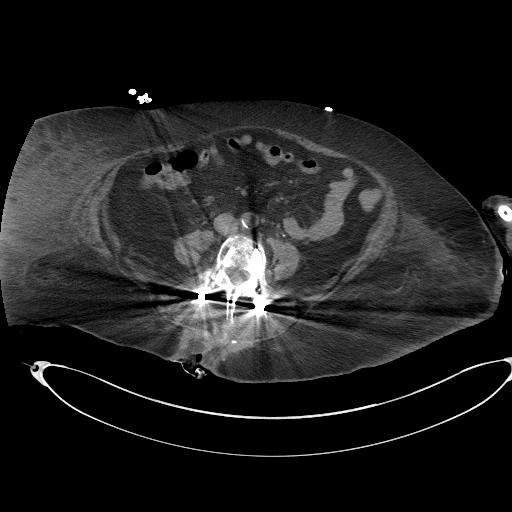
[im 3/50  bone]
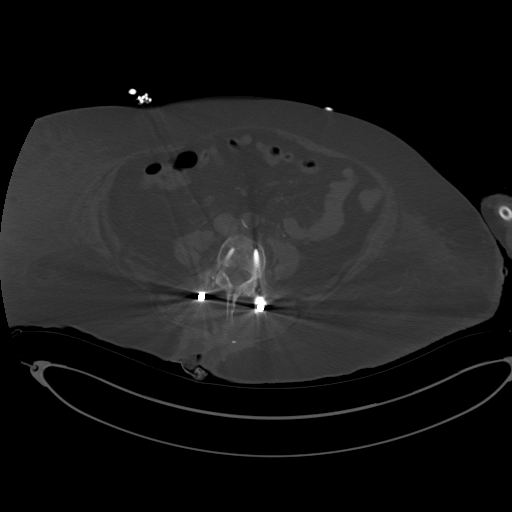
[im 8/50  soft-tissue]
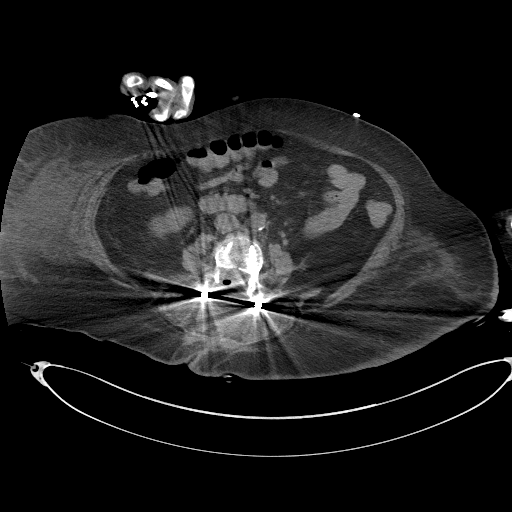
[im 12/50  soft-tissue]
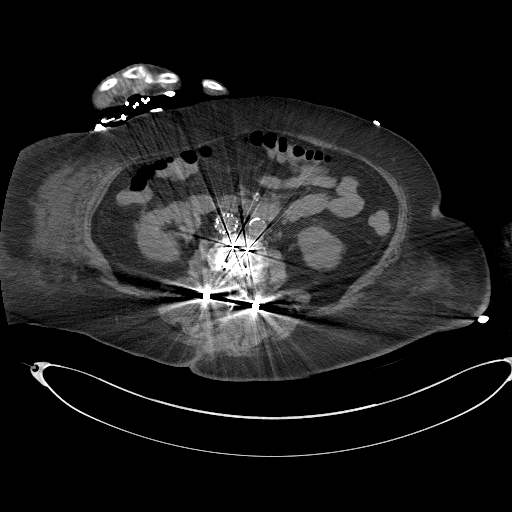
[im 15/50  soft-tissue]
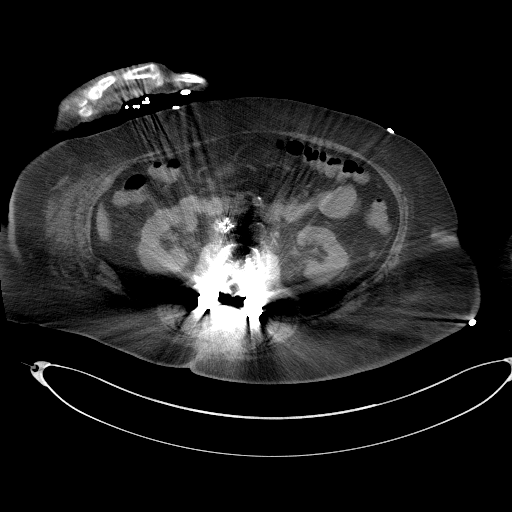
[im 19/50  soft-tissue]
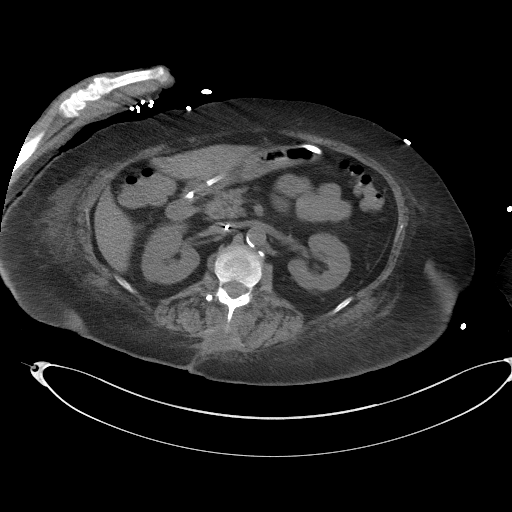
[im 24/50  soft-tissue]
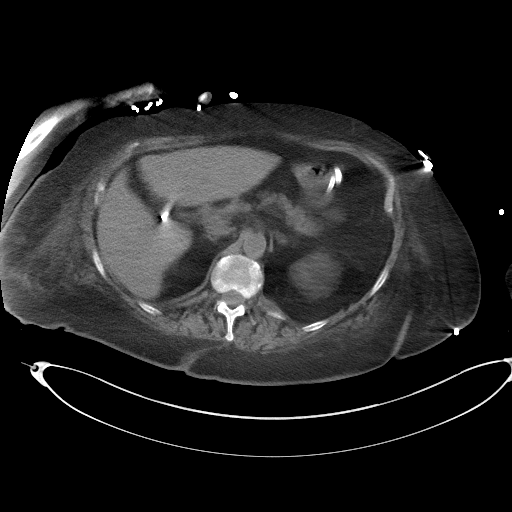
[im 26/50  soft-tissue]
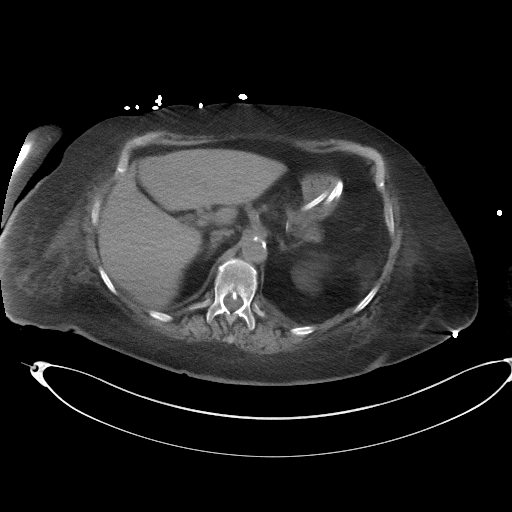
[im 31/50  soft-tissue]
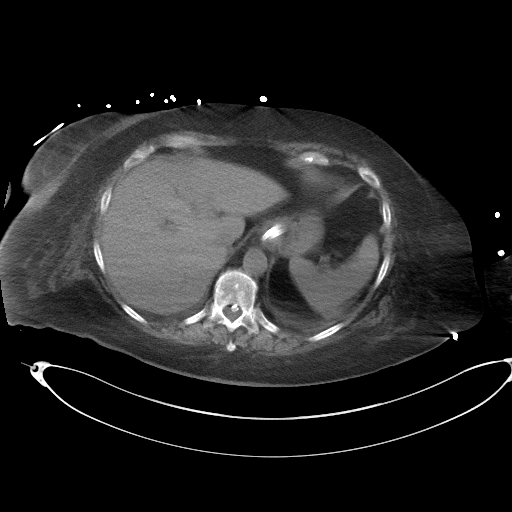
[im 36/50  soft-tissue]
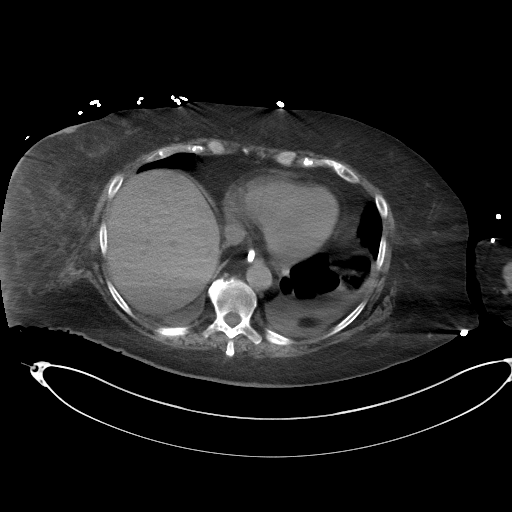
[im 36/50  bone]
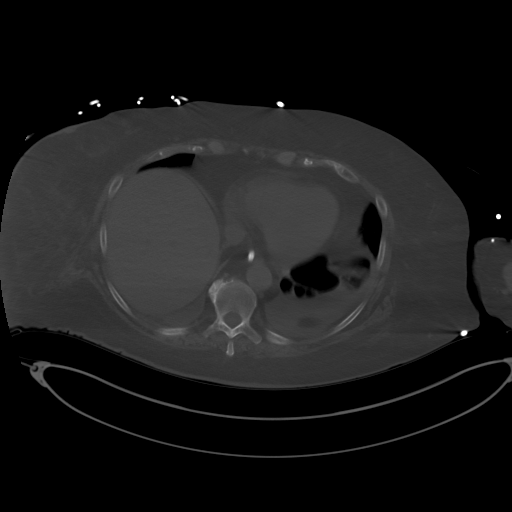
[im 38/50  soft-tissue]
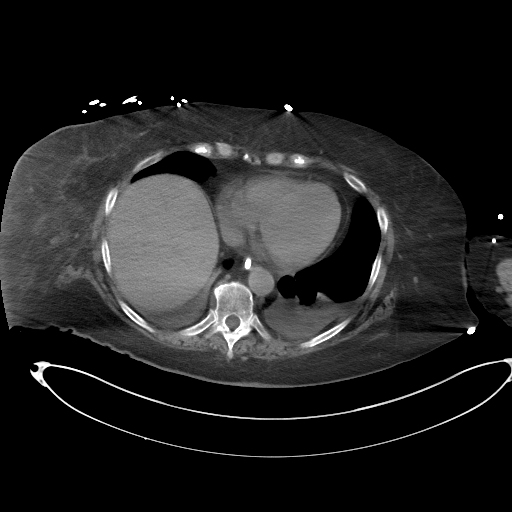
[im 43/50  soft-tissue]
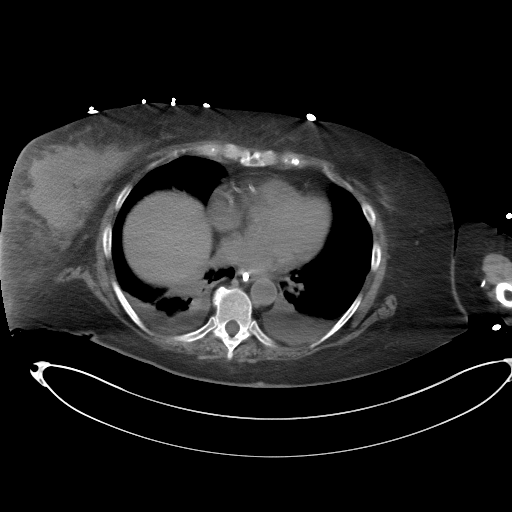
[im 47/50  soft-tissue]
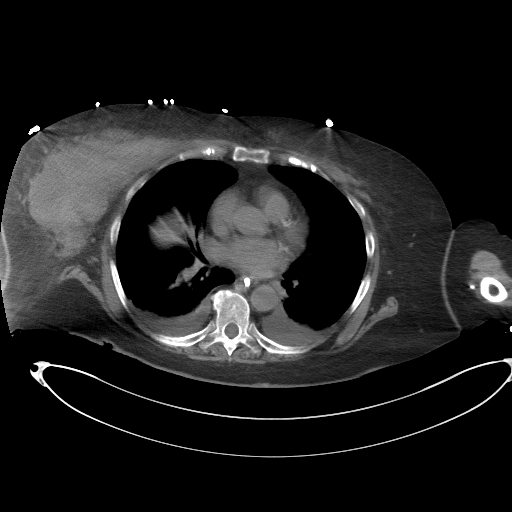

[Series 6: a/p w/o cor · coronal · non-contrast · 0.49mm/px · 3 of 127 slices shown]
[im 43/127  soft-tissue]
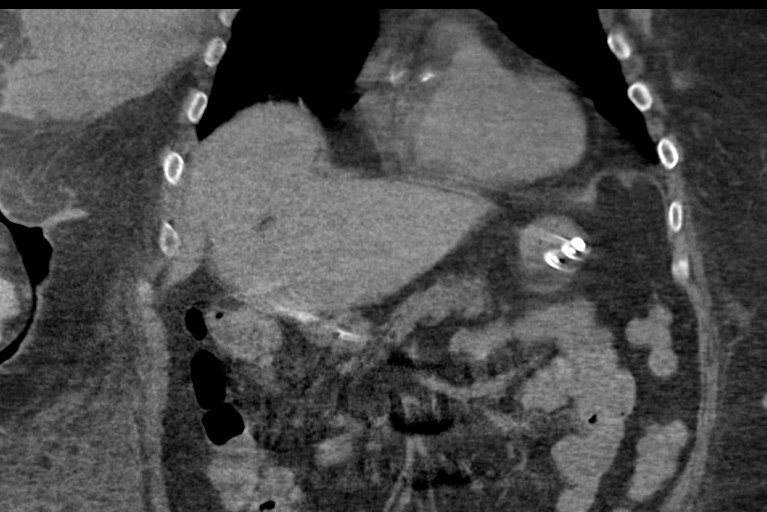
[im 57/127  soft-tissue]
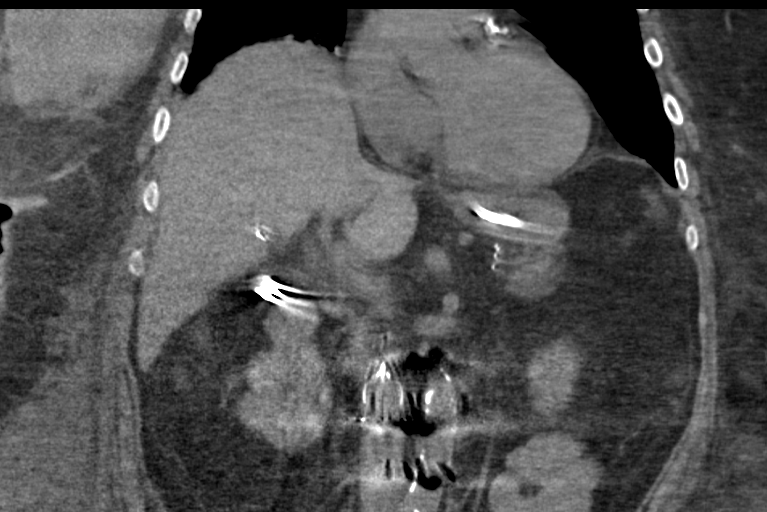
[im 71/127  soft-tissue]
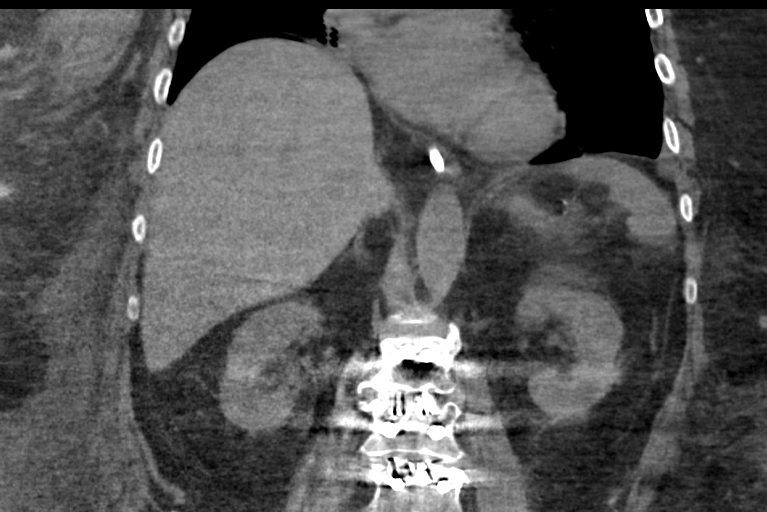

[15 of 46 positions shown; findings below may reference images not displayed]

FINDINGS: Lower chest: Small bilateral pleural effusions with bibasilar
atelectasis. Asymmetric density within the inferior right breast
tissues. This could represent asymmetric visualization of the breast
tissues. Significant density present is suggestive of potentially
some type of underlying pathologic process such as hemorrhage,
infection or neoplasm. Correlation is suggested with physical
examination.

Hepatobiliary: No focal liver abnormality is seen. Status post
cholecystectomy. No biliary dilatation.

Pancreas: Unremarkable. No pancreatic ductal dilatation or
surrounding inflammatory changes.

Spleen: Normal in size without focal abnormality.

Adrenals/Urinary Tract: Adrenal glands are unremarkable. Kidneys are
normal, without renal calculi, focal lesion, or hydronephrosis.

Stomach/Bowel: No hiatal hernia visualized. A feeding tube enters
the stomach and terminates in the proximal duodenum. The stomach is
normally positioned. There is no colonic interposition between the
stomach and the abdominal wall. No evidence of bowel obstruction,
ileus or free air.

Vascular/Lymphatic: IVC filter present in the infrarenal IVC. No
enlarged lymph nodes identified in the abdomen.

Other: No abnormal fluid collections or ascites. There is some body
wall edema in the lateral body wall soft tissues, right greater than
left.

Musculoskeletal: Evidence of lumbar spinal fusion.
IMPRESSION: 1. Feeding tube extends into the proximal duodenum. No evidence
abnormal gastric anatomy or other findings that would increase risk
of attempted percutaneous gastrostomy tube placement.
2. Small bilateral pleural effusions with bibasilar atelectasis.
3. Asymmetric density within the inferior right breast tissues. This
is suggestive of potentially underlying process such as hemorrhage,
infection or neoplasm. Correlation is suggested with physical
examination.
4. Body wall edema or hemorrhage, right greater than left, in the
lateral abdominal wall soft tissues.

## 2020-09-10 IMAGING — DX PORTABLE CHEST - 1 VIEW
1 series · 1 of 1 positions shown · non-contrast
Comparison: 10/27/2018

CLINICAL DATA: 73-year-old female with a history of respiratory
distress

EXAM:
PORTABLE CHEST 1 VIEW

[chest ap]
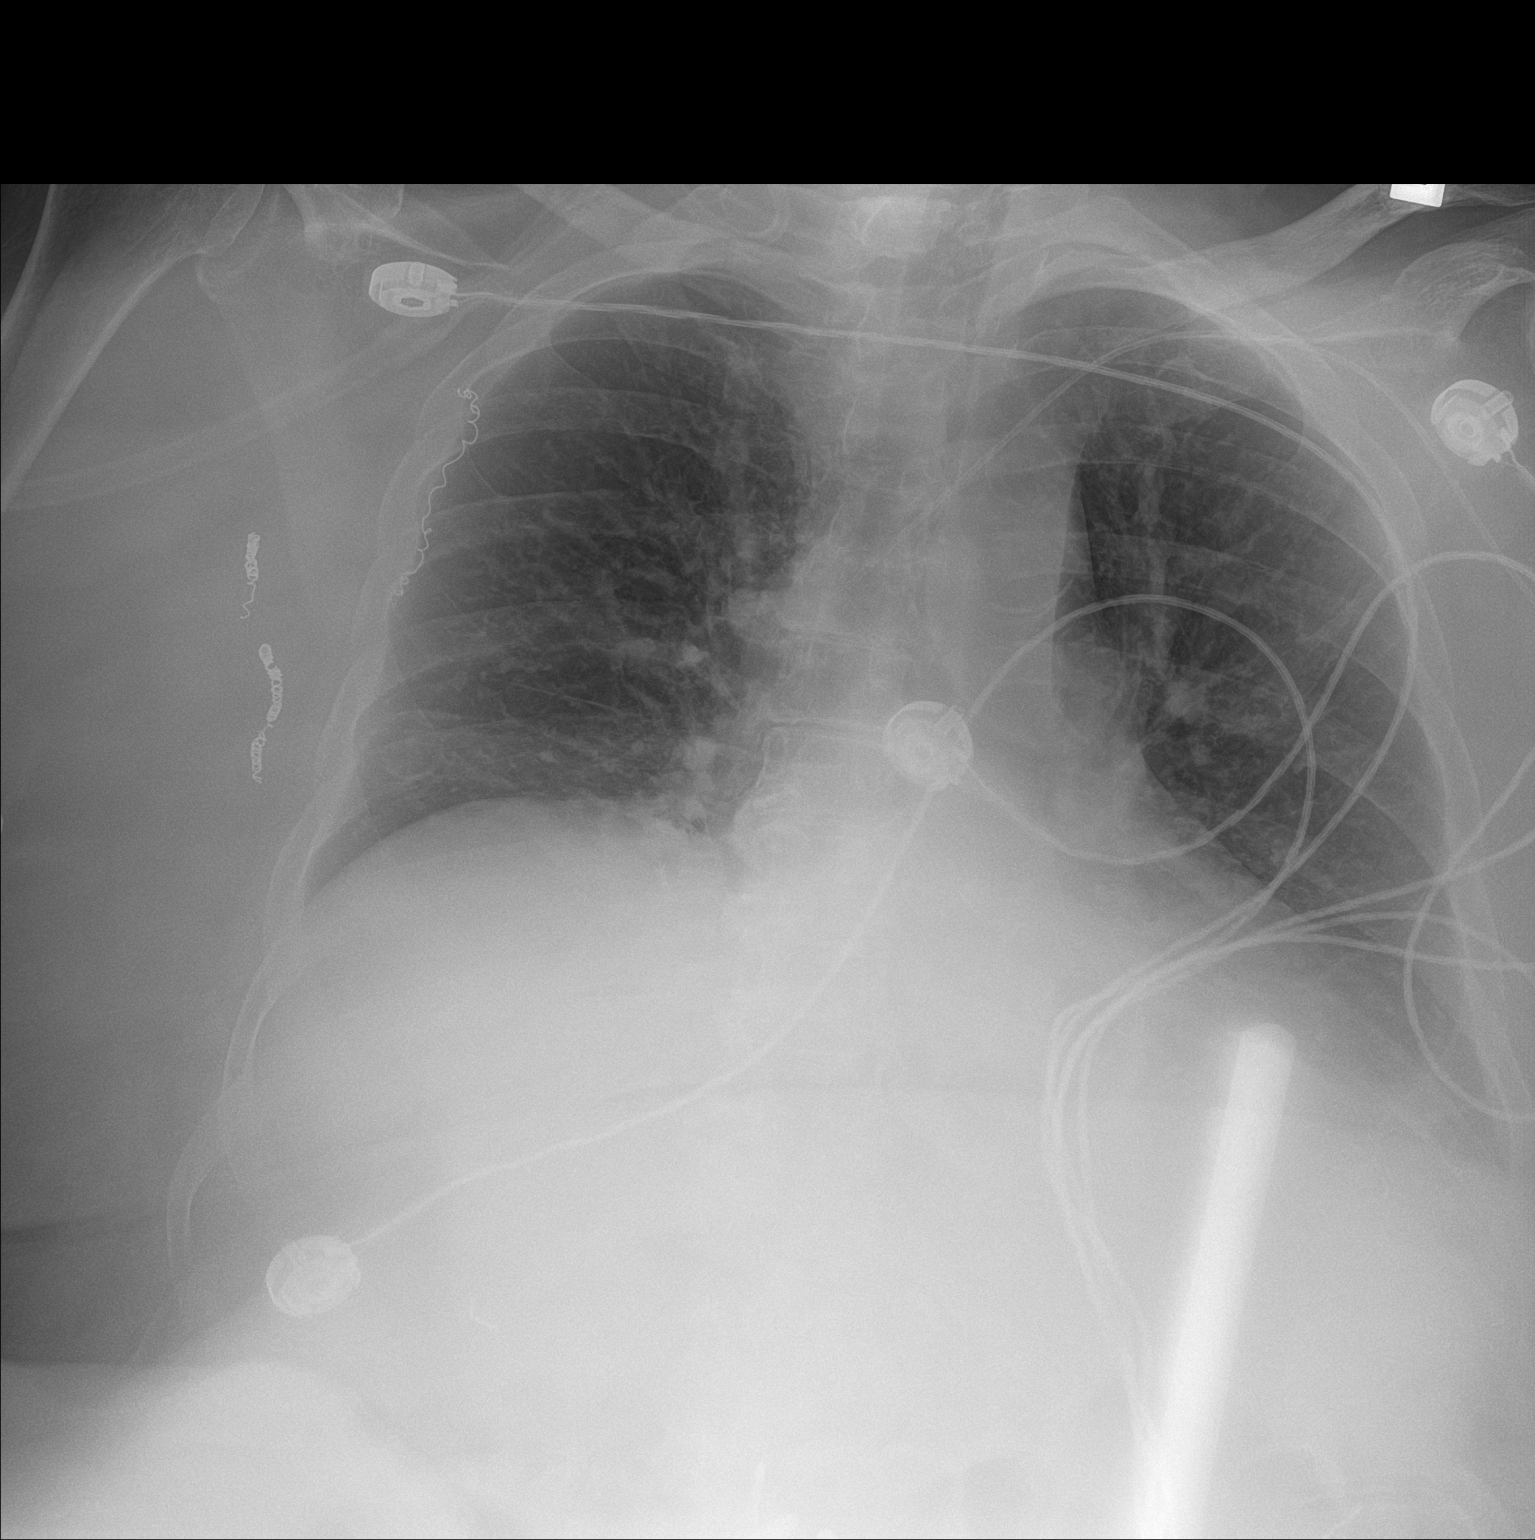

[1 of 1 positions shown; findings below may reference images not displayed]

FINDINGS: Cardiomediastinal silhouette unchanged in size and contour. No
pneumothorax. Blunting at the left costophrenic angle. No
interlobular septal thickening. No new confluent airspace disease.

Coil pack of the right chest wall.

Left upper extremity PICC is unchanged.
IMPRESSION: Low lung volumes with likely atelectasis and small left pleural
effusion.

Unchanged left upper extremity PICC.
